# Patient Record
Sex: Female | Born: 2005 | Race: White | Hispanic: No | Marital: Single | Smoking: Never smoker
Health system: Southern US, Community
[De-identification: ages and names within clinical notes are randomized; demographics above are authoritative.]

## PROBLEM LIST (undated history)

## (undated) DIAGNOSIS — K5900 Constipation, unspecified: Secondary | ICD-10-CM

## (undated) DIAGNOSIS — Z8614 Personal history of Methicillin resistant Staphylococcus aureus infection: Secondary | ICD-10-CM

## (undated) DIAGNOSIS — Z87898 Personal history of other specified conditions: Secondary | ICD-10-CM

## (undated) DIAGNOSIS — K0889 Other specified disorders of teeth and supporting structures: Secondary | ICD-10-CM

## (undated) DIAGNOSIS — J302 Other seasonal allergic rhinitis: Secondary | ICD-10-CM

## (undated) DIAGNOSIS — Z8768 Personal history of other (corrected) conditions arising in the perinatal period: Secondary | ICD-10-CM

## (undated) DIAGNOSIS — H5000 Unspecified esotropia: Secondary | ICD-10-CM

---
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
## 2024-02-22 ENCOUNTER — Encounter: Payer: Self-pay | Admitting: Physician Assistant

## 2024-02-22 ENCOUNTER — Ambulatory Visit (INDEPENDENT_AMBULATORY_CARE_PROVIDER_SITE_OTHER): Payer: PRIVATE HEALTH INSURANCE | Admitting: Physician Assistant

## 2024-02-22 VITALS — BP 105/65 | HR 110 | Temp 98.0°F | Resp 16 | Ht 64.0 in | Wt 119.0 lb

## 2024-02-22 DIAGNOSIS — Z1329 Encounter for screening for other suspected endocrine disorder: Secondary | ICD-10-CM

## 2024-02-22 DIAGNOSIS — F411 Generalized anxiety disorder: Secondary | ICD-10-CM

## 2024-02-22 DIAGNOSIS — Z91018 Allergy to other foods: Secondary | ICD-10-CM | POA: Diagnosis not present

## 2024-02-22 DIAGNOSIS — G479 Sleep disorder, unspecified: Secondary | ICD-10-CM | POA: Diagnosis not present

## 2024-02-22 DIAGNOSIS — R6881 Early satiety: Secondary | ICD-10-CM

## 2024-02-22 DIAGNOSIS — G2581 Restless legs syndrome: Secondary | ICD-10-CM

## 2024-02-22 DIAGNOSIS — R5383 Other fatigue: Secondary | ICD-10-CM

## 2024-02-22 DIAGNOSIS — E538 Deficiency of other specified B group vitamins: Secondary | ICD-10-CM

## 2024-02-22 NOTE — Progress Notes (Signed)
 " [facility] [address] [place], [place] [address]  Internal MEDICINE  Office Visit Note  Patient Name: Leslie Long  [id number]  [id number]  Date of Service: 02/22/2024  Chief Complaint  Patient presents with   Acute Visit   GI Problem    Having trouble eating full meals without feeling nausea   Sleeping Problem    Having a hard time falling asleep/staying asleep   Skin Problem    Small bumps around eyes     HPI Pt is here for a sick visit. -Getting full quickly, ongoing for a long time.. Occasional nausea after eating. Some bloating. No diarrhea or constipation typically. Some constipation.  -does have a lot of food allergies -Does have more stress recently -feels stress impacts cycle. May skip a month. Past few months has been normal. Does try to exercise daily.  -some trouble sleeping, trying magnesium just started last night. Hard to go to sleep. Feels like mind racing -feels some pain in upper legs unsure if RLS or not -tried kombucha and welps went away since starting. Not seeing allergist -no panic attacks now, but generalized stress. Not taking anything for this now -small bumps below eyes as well, does wear makeup daily, unclear trigger  Current Medication:  Outpatient Encounter Medications as of 02/22/2024  Medication Sig   [DISCONTINUED] ALPRAZolam  (XANAX ) 0.25 MG tablet Take 1 tablet (0.25 mg total) by mouth 2 (two) times daily as needed (SEVEREANXIETY/PANIC ATTACK).   [DISCONTINUED] Clindamycin Phos-Benzoyl Perox 1.2-3.75 % GEL SMARTSIG:Topical Every Evening   [DISCONTINUED] escitalopram  (LEXAPRO ) 5 MG tablet Take 1 tablet (5 mg total) by mouth daily.   [DISCONTINUED] triamcinolone  cream (KENALOG ) 0.1 % Apply 1 Application topically 2 (two) times daily. To rash on affected area until resolved.   No facility-administered encounter medications on file as of 02/22/2024.      Medical History: Past Medical History:  Diagnosis Date    Constipation    Esotropia of both eyes 01/2013   History of MRSA infection age 32   arm   History of neonatal jaundice    Loose, teeth 01/03/2013   front   Seasonal allergies      Vital Signs: BP 105/65   Pulse (!) 110   Temp 98 F (36.7 C)   Resp 16   Ht 5' 4 (1.626 m)   Wt 119 lb (54 kg)   SpO2 100%   BMI 20.43 kg/m    Review of Systems  Constitutional:  Negative for fatigue and fever.  HENT:  Negative for congestion, mouth sores and postnasal drip.   Respiratory:  Negative for cough.   Cardiovascular:  Negative for chest pain.  Gastrointestinal:  Positive for constipation and nausea.       Feeling full quickly  Genitourinary:  Negative for flank pain.  Musculoskeletal:  Positive for myalgias.  Skin:  Positive for rash.  Psychiatric/Behavioral:  Positive for sleep disturbance.     Physical Exam Vitals reviewed.  Constitutional:      General: She is not in acute distress.    Appearance: Normal appearance. She is normal weight. She is not ill-appearing.  HENT:     Head: Normocephalic and atraumatic.  Eyes:     Pupils: Pupils are equal, round, and reactive to light.  Cardiovascular:     Rate and Rhythm: Regular rhythm. Tachycardia present.  Pulmonary:     Effort: Pulmonary effort is normal. No respiratory distress.  Abdominal:     Tenderness: There is no abdominal  tenderness.  Skin:    Comments: Very small bumps below eyes, no erythema  Neurological:     Mental Status: She is alert and oriented to person, place, and time.  Psychiatric:        Attention and Perception: Attention normal.        Mood and Affect: Mood is anxious.        Speech: Speech normal.        Behavior: Behavior normal.        Thought Content: Thought content normal.        Judgment: Judgment normal.       Assessment/Plan: 1. Multiple food allergies (Primary) Skin and stomach concerns may be related to numerous food and environmental allergies. Will refer to allergist. Advised to  avoid wearing makeup or trying new topicals due to bumps on face as well - Ambulatory referral to Allergy  2. Early satiety May be related to anxiety as well as allergies. Will check labs - Ambulatory referral to Allergy  3. Sleep difficulties Trying magnesium, declines other meds for now and will monitor  4. GAD (generalized anxiety disorder) Declines medication for now and will monitor  5. RLS (restless legs syndrome) May have some RLS contributing to sleep disruption/pain in legs and will check iron studies - Fe+TIBC+Fer  6. Thyroid  disorder screen Will check thyroid  due to eating concerns - TSH + free T4  7. B12 deficiency - B12 and Folate Panel  8. Other fatigue - Fe+TIBC+Fer - CBC w/Diff/Platelet - Comprehensive metabolic panel with GFR - TSH + free T4   General Counseling: Jacki verbalizes understanding of the findings of todays visit and agrees with plan of treatment. I have discussed any further diagnostic evaluation that may be needed or ordered today. We also reviewed her medications today. she has been encouraged to call the office with any questions or concerns that should arise related to todays visit.    Counseling:    Orders Placed This Encounter  Procedures   Fe+TIBC+Fer   CBC w/Diff/Platelet   Comprehensive metabolic panel with GFR   TSH + free T4   B12 and Folate Panel   Ambulatory referral to Allergy    No orders of the defined types were placed in this encounter.   Time spent:30 Minutes "

## 2024-02-23 ENCOUNTER — Telehealth: Payer: Self-pay | Admitting: Nurse Practitioner

## 2024-02-23 NOTE — Telephone Encounter (Signed)
 Awaiting 02/22/24 office notes for Allergy referral-Toni

## 2024-03-02 ENCOUNTER — Telehealth: Payer: Self-pay | Admitting: Nurse Practitioner

## 2024-03-02 NOTE — Telephone Encounter (Signed)
 Allergy referral faxed [facility]; [phone]. Notified patient. Gave pt telephone [phone]

## 2024-03-03 ENCOUNTER — Other Ambulatory Visit: Payer: Self-pay | Admitting: Physician Assistant

## 2024-03-04 LAB — COMPREHENSIVE METABOLIC PANEL WITH GFR
ALT: 11 [IU]/L (ref 0–32)
AST: 19 [IU]/L (ref 0–40)
Albumin: 4.6 g/dL (ref 4.0–5.0)
Alkaline Phosphatase: 58 [IU]/L (ref 42–106)
BUN/Creatinine Ratio: 10 (ref 9–23)
BUN: 9 mg/dL (ref 6–20)
Bilirubin Total: 0.5 mg/dL (ref 0.0–1.2)
CO2: 24 mmol/L (ref 20–29)
Calcium: 9.5 mg/dL (ref 8.7–10.2)
Chloride: 101 mmol/L (ref 96–106)
Creatinine, Ser: 0.87 mg/dL (ref 0.57–1.00)
Globulin, Total: 2.3 g/dL (ref 1.5–4.5)
Glucose: 85 mg/dL (ref 70–99)
Potassium: 4.1 mmol/L (ref 3.5–5.2)
Sodium: 142 mmol/L (ref 134–144)
Total Protein: 6.9 g/dL (ref 6.0–8.5)
eGFR: 98 mL/min/{1.73_m2}

## 2024-03-04 LAB — CBC WITH DIFFERENTIAL/PLATELET
Basophils Absolute: 0.1 10*3/uL (ref 0.0–0.2)
Basos: 1 %
EOS (ABSOLUTE): 0.2 10*3/uL (ref 0.0–0.4)
Eos: 3 %
Hematocrit: 40.5 % (ref 34.0–46.6)
Hemoglobin: 13.6 g/dL (ref 11.1–15.9)
Immature Grans (Abs): 0 10*3/uL (ref 0.0–0.1)
Immature Granulocytes: 0 %
Lymphocytes Absolute: 1.8 10*3/uL (ref 0.7–3.1)
Lymphs: 31 %
MCH: 31.9 pg (ref 26.6–33.0)
MCHC: 33.6 g/dL (ref 31.5–35.7)
MCV: 95 fL (ref 79–97)
Monocytes Absolute: 0.4 10*3/uL (ref 0.1–0.9)
Monocytes: 7 %
Neutrophils Absolute: 3.5 10*3/uL (ref 1.4–7.0)
Neutrophils: 58 %
Platelets: 296 10*3/uL (ref 150–450)
RBC: 4.27 x10E6/uL (ref 3.77–5.28)
RDW: 12.2 % (ref 11.7–15.4)
WBC: 5.9 10*3/uL (ref 3.4–10.8)

## 2024-03-04 LAB — TSH+FREE T4
Free T4: 1.09 ng/dL (ref 0.93–1.60)
TSH: 0.996 u[IU]/mL (ref 0.450–4.500)

## 2024-03-04 LAB — IRON,TIBC AND FERRITIN PANEL
Ferritin: 24 ng/mL (ref 15–77)
Iron Saturation: 37 % (ref 15–55)
Iron: 97 ug/dL (ref 27–159)
Total Iron Binding Capacity: 261 ug/dL (ref 250–450)
UIBC: 164 ug/dL (ref 131–425)

## 2024-03-04 LAB — B12 AND FOLATE PANEL
Folate: 9.3 ng/mL
Vitamin B-12: 427 pg/mL (ref 232–1245)

## 2024-03-07 ENCOUNTER — Ambulatory Visit: Payer: Self-pay | Admitting: Nurse Practitioner

## 2024-03-24 ENCOUNTER — Ambulatory Visit: Payer: PRIVATE HEALTH INSURANCE | Admitting: Nurse Practitioner

## 2024-09-26 ENCOUNTER — Encounter: Payer: PRIVATE HEALTH INSURANCE | Admitting: Nurse Practitioner
